# Patient Record
Sex: Female | Born: 1995 | Race: White | Hispanic: No | Marital: Single
Health system: Southern US, Community
[De-identification: ages and names within clinical notes are randomized; demographics above are authoritative.]

## PROBLEM LIST (undated history)

## (undated) DIAGNOSIS — Z8614 Personal history of Methicillin resistant Staphylococcus aureus infection: Secondary | ICD-10-CM

## (undated) HISTORY — PX: OTHER SURGICAL HISTORY: SHX169

---

## 2016-11-21 ENCOUNTER — Emergency Department (HOSPITAL_COMMUNITY)
Admission: EM | Admit: 2016-11-21 | Discharge: 2016-11-21 | Disposition: A | Discharge status: Home / Self Care | Payer: Self-pay | Attending: Emergency Medicine | Admitting: Emergency Medicine

## 2016-11-21 ENCOUNTER — Emergency Department (HOSPITAL_COMMUNITY): Payer: Self-pay

## 2016-11-21 ENCOUNTER — Encounter (HOSPITAL_COMMUNITY): Payer: Self-pay | Admitting: Emergency Medicine

## 2016-11-21 DIAGNOSIS — R0602 Shortness of breath: Secondary | ICD-10-CM

## 2016-11-21 DIAGNOSIS — R079 Chest pain, unspecified: Secondary | ICD-10-CM | POA: Insufficient documentation

## 2016-11-21 DIAGNOSIS — F17210 Nicotine dependence, cigarettes, uncomplicated: Secondary | ICD-10-CM | POA: Insufficient documentation

## 2016-11-21 DIAGNOSIS — N6320 Unspecified lump in the left breast, unspecified quadrant: Secondary | ICD-10-CM | POA: Insufficient documentation

## 2016-11-21 DIAGNOSIS — N644 Mastodynia: Secondary | ICD-10-CM | POA: Insufficient documentation

## 2016-11-21 HISTORY — DX: Personal history of Methicillin resistant Staphylococcus aureus infection: Z86.14

## 2016-11-21 LAB — CBC
HCT: 38.8 % (ref 36.0–46.0)
Hemoglobin: 13.2 g/dL (ref 12.0–15.0)
MCH: 31.4 pg (ref 26.0–34.0)
MCHC: 34 g/dL (ref 30.0–36.0)
MCV: 92.4 fL (ref 78.0–100.0)
PLATELETS: 275 10*3/uL (ref 150–400)
RBC: 4.2 MIL/uL (ref 3.87–5.11)
RDW: 14 % (ref 11.5–15.5)
WBC: 8.6 10*3/uL (ref 4.0–10.5)

## 2016-11-21 LAB — BASIC METABOLIC PANEL
Anion gap: 10 (ref 5–15)
BUN: 16 mg/dL (ref 6–20)
CALCIUM: 9.1 mg/dL (ref 8.9–10.3)
CO2: 23 mmol/L (ref 22–32)
CREATININE: 0.84 mg/dL (ref 0.44–1.00)
Chloride: 103 mmol/L (ref 101–111)
Glucose, Bld: 89 mg/dL (ref 65–99)
Potassium: 3 mmol/L — ABNORMAL LOW (ref 3.5–5.1)
SODIUM: 136 mmol/L (ref 135–145)

## 2016-11-21 LAB — I-STAT TROPONIN, ED: TROPONIN I, POC: 0.01 ng/mL (ref 0.00–0.08)

## 2016-11-21 LAB — D-DIMER, QUANTITATIVE: D-Dimer, Quant: 0.33 ug/mL-FEU (ref 0.00–0.50)

## 2016-11-21 LAB — I-STAT BETA HCG BLOOD, ED (MC, WL, AP ONLY)

## 2016-11-21 MED ORDER — OPTICHAMBER DIAMOND MISC
1.0000 | Freq: Once | Status: AC
Start: 2016-11-21 — End: 2016-11-21
  Administered 2016-11-21: 1
  Filled 2016-11-21 (×2): qty 1

## 2016-11-21 MED ORDER — ALBUTEROL SULFATE HFA 108 (90 BASE) MCG/ACT IN AERS
2.0000 | INHALATION_SPRAY | Freq: Once | RESPIRATORY_TRACT | Status: AC
  Administered 2016-11-21: 2 via RESPIRATORY_TRACT
  Filled 2016-11-21: qty 6.7

## 2016-11-21 NOTE — ED Provider Notes (Signed)
[facility]-EMERGENCY DEPT Provider Note   CSN: [id number] Arrival date & time: 11/21/16  0919     History   Chief Complaint Chief Complaint  Patient presents with  . Breast Pain    HPI Kaitlyn Bryan is a 21 y.o. female who presents to the emergency department with a chief complaint of worsening, constant, sharp chest pain that began 2 days ago. She reports the pain is in her central chest and radiates to her back. She reports is aggravated with taking deep breaths and improved with laying flat. She also reports a lump to her left breast around the same time she noticed the pain, but states she is not sure if the pain is coming from her breast or from her chest. She also complains of associated dyspnea. She denies fever, chills, nausea, vomiting, or abdominal pain. No numbness or weakness.  She reports her mother was diagnosed with breast cancer, but is unsure of what age. She is a current smoker, but smokes more when she is anxious, and is unable to quantify her daily cigarette intake. No IV drug use. No recent travel or immobilization. She is not on OCPs.  The history is provided by the patient. No language interpreter was used.    Past Medical History:  Diagnosis Date  . History of methicillin resistant staphylococcus aureus (MRSA)     There are no active problems to display for this patient.   Past Surgical History:  Procedure Laterality Date  . Soft tissue surgery following Brown Recluse bite      OB History    No data available       Home Medications    Prior to Admission medications   Not on File    Family History No family history on file.  Social History Social History  Substance Use Topics  . Smoking status: Not on file  . Smokeless tobacco: Not on file  . Alcohol use Not on file     Allergies   Tape   Review of Systems Review of Systems  Constitutional: Negative for activity change.  Respiratory: Positive for shortness of breath.   Cardiovascular:  Positive for chest pain.  Gastrointestinal: Negative for abdominal pain.  Musculoskeletal: Positive for back pain.       Left breast pain  Skin: Negative for rash.     Physical Exam Updated Vital Signs BP 115/73 (BP Location: Left Arm)   Pulse 86   Temp 98.6 F (37 C) (Oral)   Resp 18   LMP 10/21/2016 (Approximate) Comment: Had "3 periods" in August.  SpO2 99%   Physical Exam  Constitutional: No distress.  HENT:  Head: Normocephalic.  Eyes: Conjunctivae are normal.  Neck: Neck supple. No JVD present. No tracheal deviation present. No thyromegaly present.  Cardiovascular: Normal rate, regular rhythm, normal heart sounds and intact distal pulses.  Exam reveals no gallop and no friction rub.   No murmur heard. Pulmonary/Chest: Effort normal. No stridor. No respiratory distress. She has no wheezes. She has no rales. Right breast exhibits no inverted nipple, no mass and no nipple discharge. Left breast exhibits mass. Left breast exhibits no inverted nipple and no nipple discharge.    Abdominal: Soft. She exhibits no distension.  Musculoskeletal: She exhibits no edema.  Neurological: She is alert.  Skin: Skin is warm. No rash noted.  Psychiatric: Her behavior is normal.  Nursing note and vitals reviewed.    ED Treatments / Results  Labs (all labs ordered are listed, but only abnormal  results are displayed) Labs Reviewed  BASIC METABOLIC PANEL - Abnormal; Notable for the following:       Result Value   Potassium 3.0 (*)    All other components within normal limits  CBC  D-DIMER, QUANTITATIVE (NOT AT [facility])  I-STAT TROPONIN, ED  I-STAT BETA HCG BLOOD, ED ([facility], [facility], [facility] ONLY)    EKG  EKG Interpretation  Date/Time:  Sunday November 21 2016 10:57:57 EDT Ventricular Rate:  56 PR Interval:  134 QRS Duration: 80 QT Interval:  406 QTC Calculation: 391 R Axis:   72 Text Interpretation:  Sinus bradycardia with sinus arrhythmia Otherwise normal ECG No old tracing to compare  Confirmed by Belfi, Melanie ([id number]) on 11/21/2016 12:11:20 PM       Radiology Dg Chest 2 View  Result Date: 11/21/2016 CLINICAL DATA:  Bilateral pleuritic chest pain radiating to the back. Dyspnea. EXAM: CHEST  2 VIEW COMPARISON:  None. FINDINGS: Normal sized heart. Clear lungs. Minimal diffuse peribronchial thickening. Mild scoliosis. IMPRESSION: Minimal bronchitic changes. Electronically Signed   By: Steven  Reid M.D.   On: 11/21/2016 11:29    Procedures Procedures (including critical care time)  EMERGENCY DEPARTMENT US SOFT TISSUE INTERPRETATION "Study: Limited Soft Tissue Ultrasound"  INDICATIONS: Pain Multiple views of the body part were obtained in real-time with a multi-frequency linear probe  PERFORMED BY: Myself IMAGES ARCHIVED?: Yes SIDE:Left BODY PART:Breast INTERPRETATION:  No abcess noted    Medications Ordered in ED Medications  albuterol (PROVENTIL HFA;VENTOLIN HFA) 108 (90 Base) MCG/ACT inhaler 2 puff (not administered)  optichamber diamond 1 each (not administered)     Initial Impression / Assessment and Plan / ED Course  I have reviewed the triage vital signs and the nursing notes.  Pertinent labs & imaging results that were available during my care of the patient were reviewed by me and considered in my medical decision making (see chart for details).     21  year old female presenting with pleuritic chest pain radiating into the back with dyspnea and left breast pain. Patient is to be discharged with recommendation to follow up with PCP in regards to today's hospital visit. Chest pain is not likely of cardiac or pulmonary etiology d/t presentation, PERC negative, VSS, no tracheal deviation, no JVD or new murmur, RRR, breath sounds equal bilaterally, EKG without acute abnormalities, negative troponin. Pt has been advised to return to the ED if CP becomes exertional, associated with diaphoresis or nausea, radiates to left jaw/arm, worsens or becomes concerning  in any way. No mediastinal widening on chest x-ray; consistent with bronchitis. Will provide the patient with a spacer and albuterol inhaler in the ED today.  Bedside ultrasound of the left breast is unremarkable for soft tissue infection or abscess. The patient has not established with primary care will provide a referral to [facility] and wellness to get established with primary care and then instructed the patient to call the [facility] and [facility] Center to to schedule a breast ultrasound. Discussed this plan with the patient who acknowledged and agreed to the plan.   Strict return precautions given. No acute distress. The patient is safe for discharge at this time.     Final Clinical Impressions(s) / ED Diagnoses   Final diagnoses:  Shortness of breath  Painful lumpy left breast    New Prescriptions New Prescriptions   No medications on file     Barkley Boards, PA-C 11/21/16 1246    Alvira Monday, MD 11/23/16 0041

## 2016-11-21 NOTE — Discharge Instructions (Signed)
For your shortness of breath, you may use the albuterol inhaler with the spacer. Take 2 puffs every 4 hours for shortness of breath. If you develop a fever, a cough with mucus, or worsening shortness of breath after using the inhaler, please return to the emergency department for re-evaluation.  Please call [facility] and wellness first to get established with primary care. After you have call [facility] and wellness, please call the [facility] to schedule a breast ultrasound.

## 2016-11-21 NOTE — ED Notes (Signed)
LABS drawn.

## 2016-11-21 NOTE — ED Notes (Signed)
Aerochamber ordered from pharmacy.

## 2016-11-21 NOTE — ED Notes (Signed)
Still trying to get an aerochamber.

## 2016-11-21 NOTE — ED Notes (Signed)
Patient transported to X-ray 

## 2018-03-05 IMAGING — CR DG CHEST 2V
2 series · 2 of 2 positions shown · non-contrast
Comparison: None.

CLINICAL DATA: Bilateral pleuritic chest pain radiating to the
back. Dyspnea.

EXAM:
CHEST  2 VIEW

[chest pa]
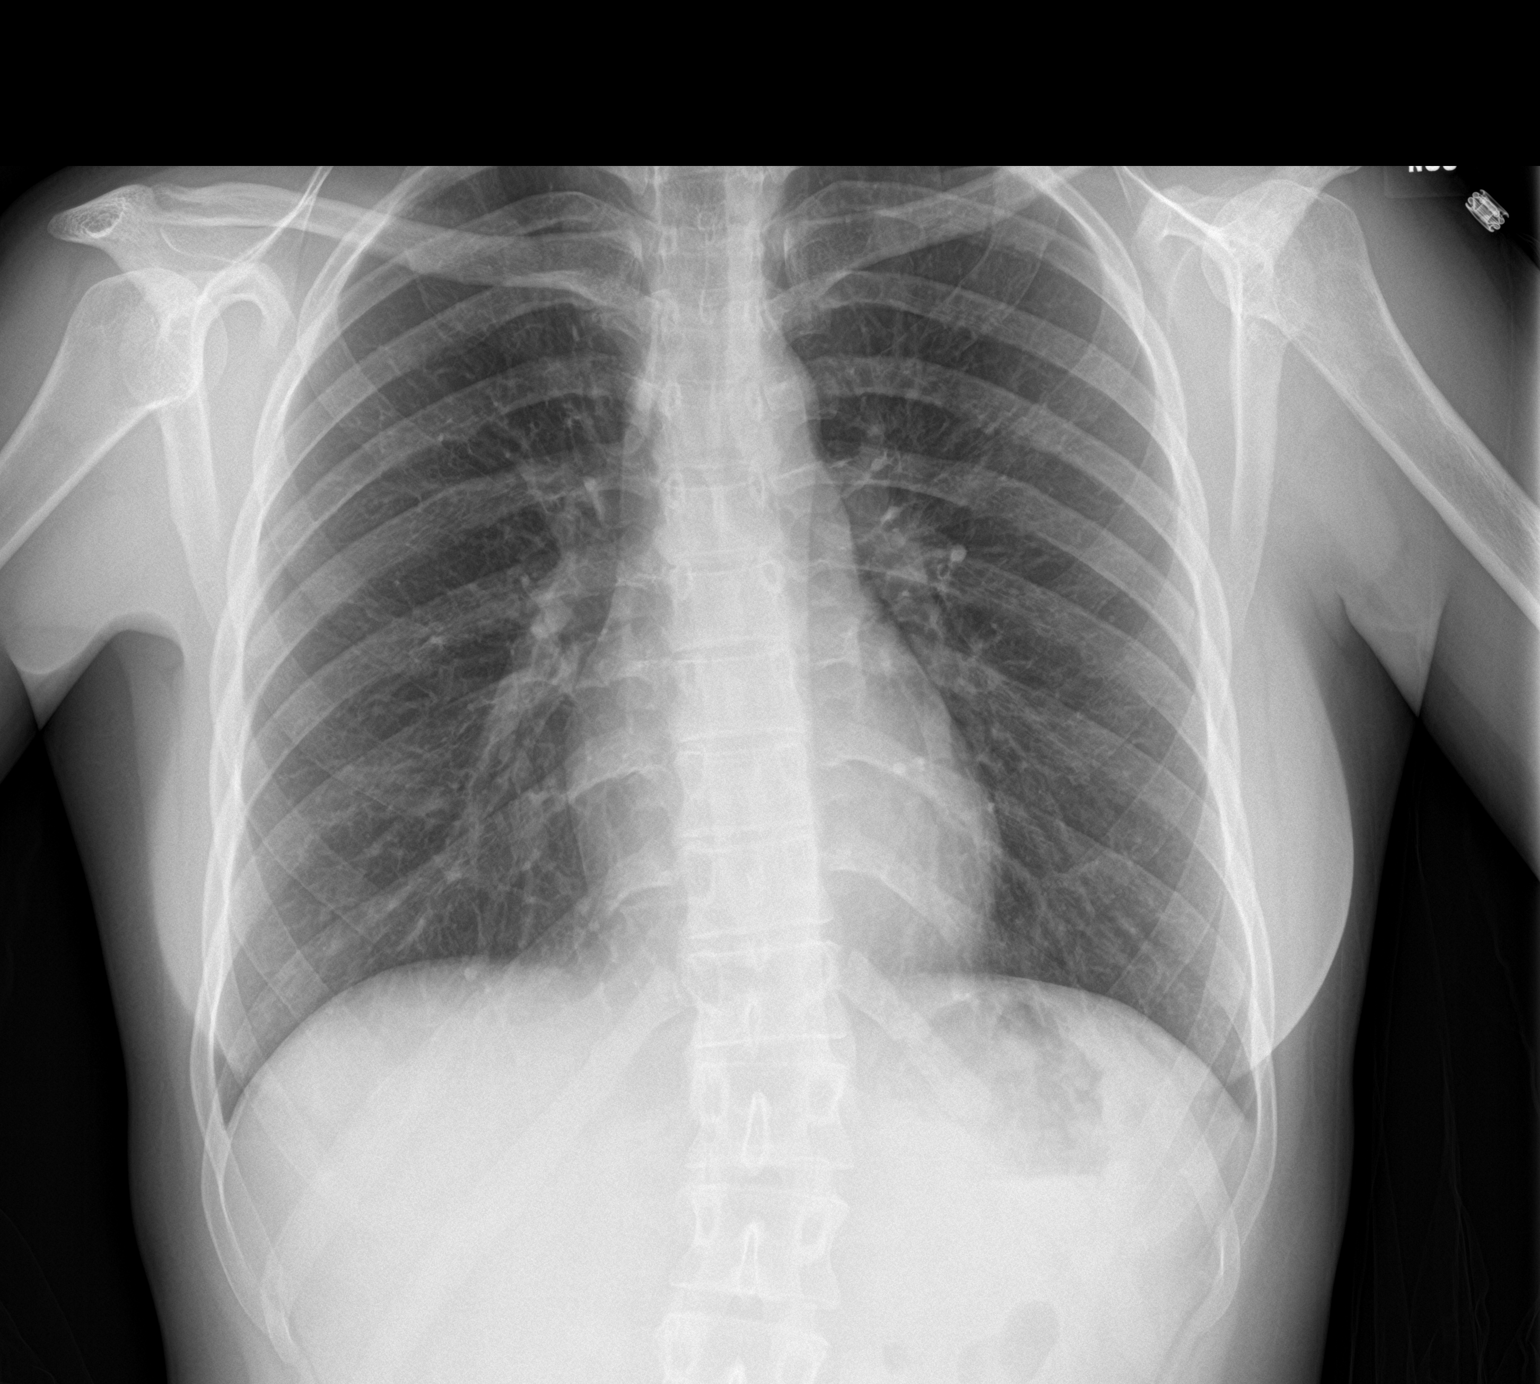

[chest lat]
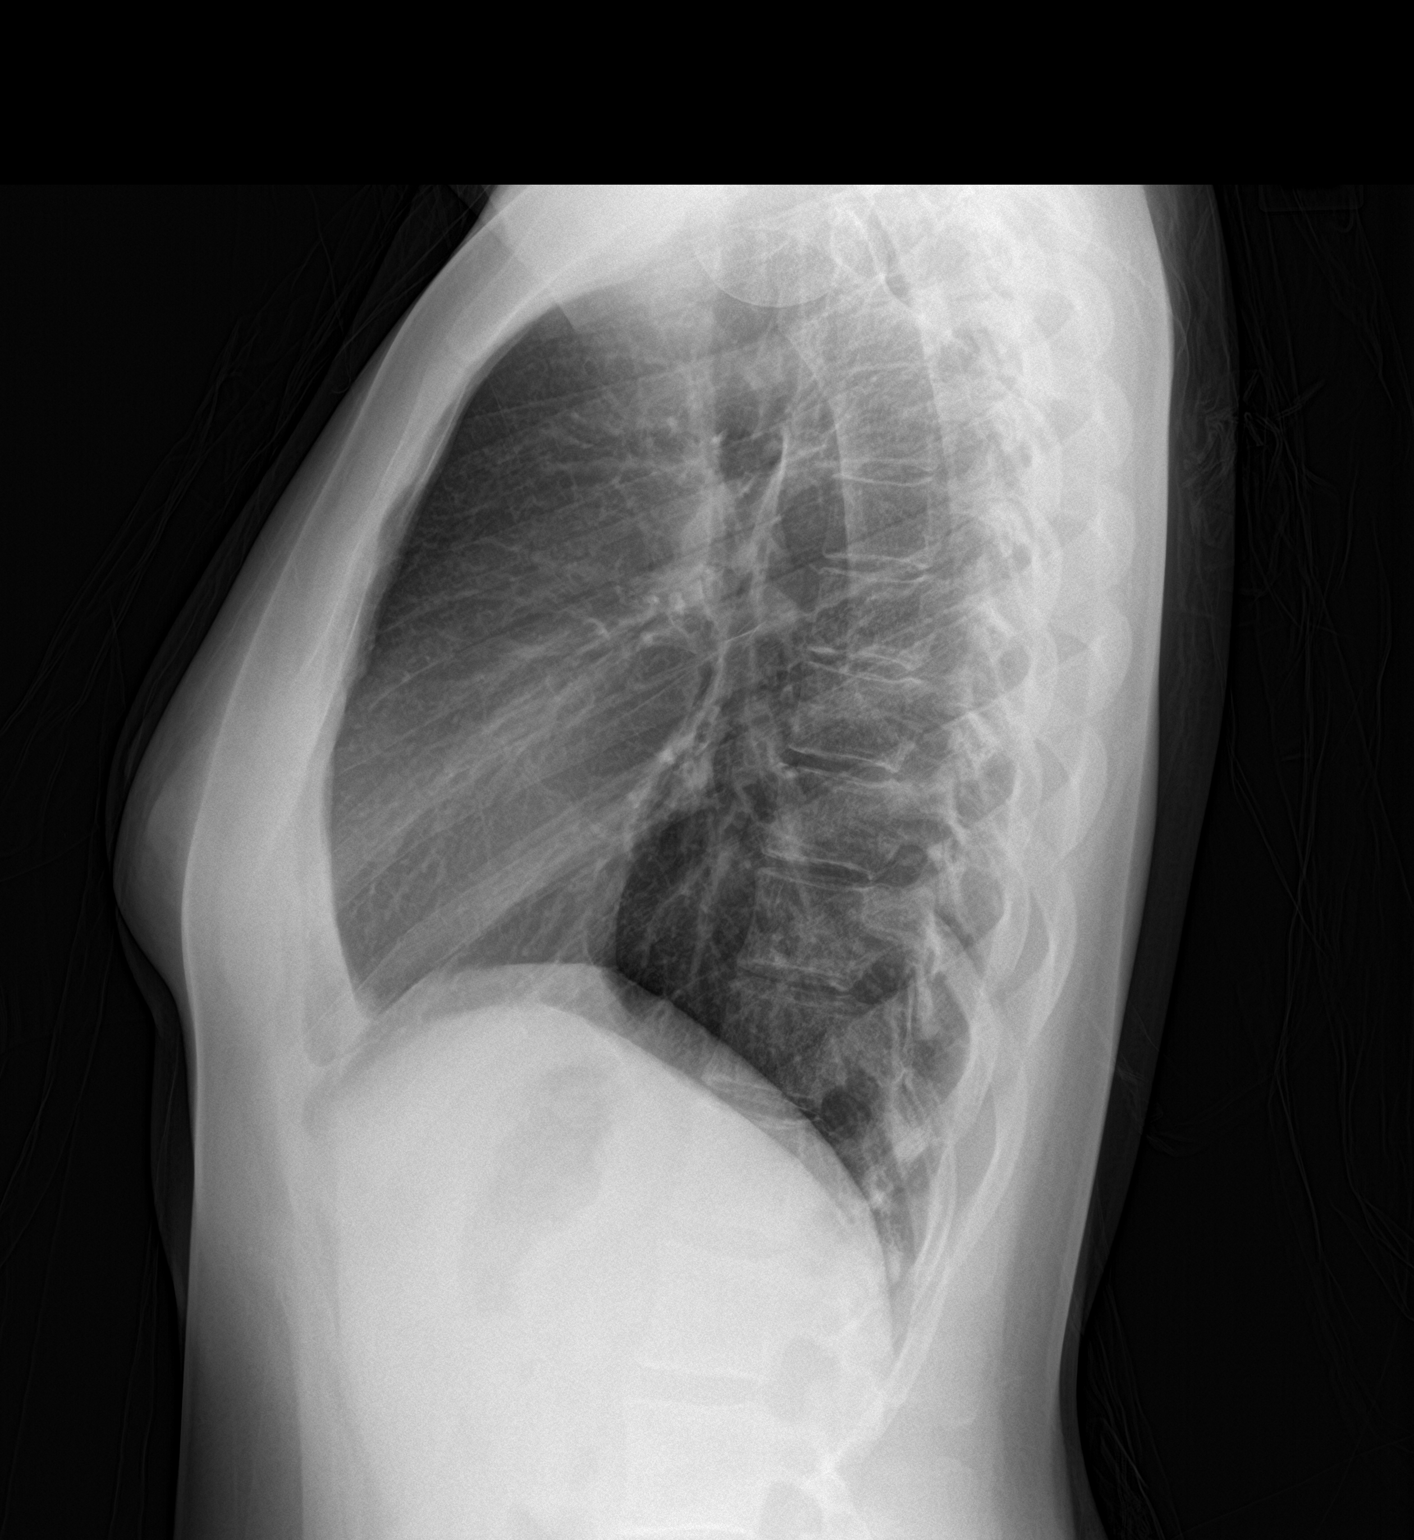

[2 of 2 positions shown; findings below may reference images not displayed]

FINDINGS: Normal sized heart. Clear lungs. Minimal diffuse peribronchial
thickening. Mild scoliosis.
IMPRESSION: Minimal bronchitic changes.

## 2021-04-22 DEATH — deceased
